# Patient Record
Sex: Male | Born: 2000 | Race: White | Hispanic: No | Marital: Single
Health system: Southern US, Community
[De-identification: ages and names within clinical notes are randomized; demographics above are authoritative.]

## PROBLEM LIST (undated history)

## (undated) HISTORY — PX: OTHER SURGICAL HISTORY: SHX169

---
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
## 2023-05-27 ENCOUNTER — Other Ambulatory Visit (HOSPITAL_COMMUNITY): Payer: Self-pay | Admitting: Physician Assistant

## 2023-05-27 ENCOUNTER — Ambulatory Visit (HOSPITAL_COMMUNITY)
Admission: RE | Admit: 2023-05-27 | Discharge: 2023-05-27 | Disposition: A | Discharge status: Home / Self Care | Payer: Medicaid Other | Source: Ambulatory Visit | Attending: Physician Assistant | Admitting: Physician Assistant

## 2023-05-27 DIAGNOSIS — M545 Low back pain, unspecified: Secondary | ICD-10-CM | POA: Insufficient documentation
